# Patient Record
Sex: Male | Born: 1990 | Race: Black or African American | Hispanic: No | Marital: Married | Smoking: Never smoker
Health system: Southern US, Community
[De-identification: ages and names within clinical notes are randomized; demographics above are authoritative.]

## PROBLEM LIST (undated history)

## (undated) HISTORY — PX: FOOT SURGERY: SHX648

---
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
## 2012-11-26 ENCOUNTER — Emergency Department (HOSPITAL_COMMUNITY)
Admission: EM | Admit: 2012-11-26 | Discharge: 2012-11-26 | Disposition: A | Discharge status: Home / Self Care | Payer: Self-pay | Attending: Emergency Medicine | Admitting: Emergency Medicine

## 2012-11-26 ENCOUNTER — Encounter (HOSPITAL_COMMUNITY): Payer: Self-pay

## 2012-11-26 ENCOUNTER — Emergency Department (HOSPITAL_COMMUNITY): Payer: Self-pay

## 2012-11-26 DIAGNOSIS — R51 Headache: Secondary | ICD-10-CM

## 2012-11-26 DIAGNOSIS — L02619 Cutaneous abscess of unspecified foot: Secondary | ICD-10-CM | POA: Insufficient documentation

## 2012-11-26 DIAGNOSIS — L03039 Cellulitis of unspecified toe: Secondary | ICD-10-CM | POA: Insufficient documentation

## 2012-11-26 DIAGNOSIS — L089 Local infection of the skin and subcutaneous tissue, unspecified: Secondary | ICD-10-CM

## 2012-11-26 MED ORDER — KETOROLAC TROMETHAMINE 60 MG/2ML IM SOLN
60.0000 mg | Freq: Once | INTRAMUSCULAR | Status: AC
  Administered 2012-11-26: 60 mg via INTRAMUSCULAR
  Filled 2012-11-26: qty 2

## 2012-11-26 MED ORDER — CIPROFLOXACIN HCL 500 MG PO TABS: 500.0000 mg | ORAL_TABLET | Freq: Two times a day (BID) | ORAL | Status: DC

## 2012-11-26 NOTE — Discharge Instructions (Signed)
Take antibiotic as prescribed.  Follow up with the podiatrist.  Please return to the ER if your headache or swelling/redness/pain in toe worsens or you develop associated fever.

## 2012-11-26 NOTE — ED Provider Notes (Signed)
History     CSN: [id number]  Arrival date & time 11/26/12  4098   First MD Initiated Contact with Patient 11/26/12 [phone]      Chief Complaint  Patient presents with  . Headache  . Abscess    (Consider location/radiation/quality/duration/timing/severity/associated sxs/prior treatment) HPI History provided by pt.   Pt presents w/ multiple complaints.  Has had an intermittent, throbbing, non-radiating, right-sided headache since yesterday at 6pm.  No associated fever, dizziness, vision changes, extremity weakness/paresthesias, N/V.  Denies head trauma.  No h/o migraines.  Also c/o L great toe pain x several months, acutely worsened over past 2-3 days.  Now with drainage around toenail as well.  Believes he stubbed it at work prior to onset. No PMH.   History reviewed. No pertinent past medical history.  History reviewed. No pertinent past surgical history.  No family history on file.  History  Substance Use Topics  . Smoking status: Never Smoker   . Smokeless tobacco: Not on file  . Alcohol Use: No      Review of Systems  All other systems reviewed and are negative.    Allergies  Review of patient's allergies indicates no known allergies.  Home Medications  No current outpatient prescriptions on file.  BP 134/72  Pulse 59  Temp(Src) 98.6 F (37 C) (Oral)  Resp 18  Ht 5\' 6"  (1.676 m)  Wt 130 lb (58.968 kg)  BMI 20.99 kg/m2  SpO2 99%  Physical Exam  Nursing note and vitals reviewed. Constitutional: He is oriented to person, place, and time. He appears well-developed and well-nourished. No distress.  HENT:  Head: Normocephalic and atraumatic.  Eyes:  Normal appearance  Neck: Normal range of motion.  Cardiovascular: Normal rate, regular rhythm and intact distal pulses.   Pulmonary/Chest: Effort normal and breath sounds normal.  Musculoskeletal: Normal range of motion.  L great toe edematous compared to R.  Lateral cuticle macerated.  Erythema, yellow drainage  and tenderness bilateral cuticles.  No pain w/ flexion IP joint.  Distal sensation intact.   Neurological: He is alert and oriented to person, place, and time. No sensory deficit. Coordination normal.  CN 3-12 intact.  No nystagmus. 5/5 and equal upper and lower extremity strength.  No past pointing.     Skin: Skin is warm and dry. No rash noted.  Psychiatric: He has a normal mood and affect. His behavior is normal.    ED Course  Procedures (including critical care time)  Labs Reviewed - No data to display No results found.   1. Headache   2. Toe infection       MDM  Healthy 21yo M presents w/ non-traumatic headache.  On exam, pt well-appearing, afebrile, no focal neuro deficits or meningeal signs.  Will treat symptomatically w/ 60mg  IM toradol and reassess.  Also c/o L great toe pain.  Edema, erythema, yellow drainage and tenderness medial and lateral cuticles.  Xray ordered to r/o osteomyelitis.  5:30 AM   Xray negative.  Dr. Freida Busman has examined toe and recommends cipro and podiatry f/u.  Nursing staff has wrapped.  Headache much improved.  I recommended ibuprofen for head/toe pain. Return precautions discussed.  6:09 AM       Otilio Miu, PA-C 11/26/12 [phone]

## 2012-11-26 NOTE — ED Notes (Signed)
Pt states he developed headache right side of his head while at work-states has not tried to take any OTC meds for it.  Ambulatory and speaking full sentences.  Pt also c/o "hang nail" left great toe for the past several months.

## 2012-11-27 NOTE — ED Provider Notes (Signed)
Medical screening examination/treatment/procedure(s) were performed by non-physician practitioner and as supervising physician I was immediately available for consultation/collaboration.  Francenia Chimenti T Mignonne Afonso, MD 11/27/12 0312 

## 2012-11-29 ENCOUNTER — Encounter: Payer: Self-pay | Admitting: Podiatry

## 2012-11-29 ENCOUNTER — Ambulatory Visit (INDEPENDENT_AMBULATORY_CARE_PROVIDER_SITE_OTHER): Payer: Self-pay | Admitting: Podiatry

## 2012-11-29 VITALS — BP 144/70 | HR 72 | Ht 66.0 in | Wt 138.0 lb

## 2012-11-29 DIAGNOSIS — L600 Ingrowing nail: Secondary | ICD-10-CM

## 2012-11-29 DIAGNOSIS — L089 Local infection of the skin and subcutaneous tissue, unspecified: Secondary | ICD-10-CM

## 2012-11-29 NOTE — Progress Notes (Signed)
SUBJECTIVE: 22 y.o. year old male presents with infected ingrown toe nails left great toe for duration of 2 months. He was seen at ER and prescribed antibiotics last week, Cipro 500 mg bid.   OBJECTIVE: DERMATOLOGIC EXAMINATION: Infected and draining toe nail from chronic ingrown nail left hallux with proud flesh.  VASCULAR EXAMINATION OF LOWER LIMBS: Pedal pulses: All pedal pulses are palpable with normal pulsation.   NEUROLOGIC EXAMINATION OF THE LOWER LIMBS: All epicritic and tactile sensations grossly intact.  MUSCULOSKELETAL EXAMINATION: Rectus foot without gross deformities.    ASSESSMENT: Un resolved infected ingrown nail left hallux with Antibiotic therapy.  PLAN: Affected toe was anesthetized with total 5ml mixture of 50/50 0.5% Marcaine plain and 1% Xylocaine plain. Left great toe both borders were reflected with a nail elevator and excised with nail nipper. Proximal nail matrix tissue was cauterized with Phenol soaked cotton applicator x 4 and neutralized with Alcohol soaked cotton applicator. The wound was dressed with Amerigel ointment dressing. Home care instructions and supply dispensed.  Return in 1 week for follow up.

## 2012-12-06 ENCOUNTER — Encounter: Payer: Self-pay | Admitting: Podiatry

## 2012-12-11 ENCOUNTER — Encounter: Payer: Self-pay | Admitting: Podiatry

## 2013-04-01 ENCOUNTER — Emergency Department (HOSPITAL_COMMUNITY)
Admission: EM | Admit: 2013-04-01 | Discharge: 2013-04-01 | Disposition: A | Discharge status: Home / Self Care | Payer: Self-pay | Source: Home / Self Care | Attending: Emergency Medicine | Admitting: Emergency Medicine

## 2013-04-01 ENCOUNTER — Encounter (HOSPITAL_COMMUNITY): Payer: Self-pay | Admitting: Emergency Medicine

## 2013-04-01 MED ORDER — PREDNISONE 10 MG PO TABS: ORAL_TABLET | ORAL | Status: DC

## 2013-04-01 NOTE — ED Provider Notes (Signed)
Medical screening examination/treatment/procedure(s) were performed by non-physician practitioner and as supervising physician I was immediately available for consultation/collaboration.  Leslee Home, M.D.  Reuben Likes, MD 04/01/13 [phone]

## 2013-04-01 NOTE — ED Notes (Signed)
Reports headahce that started Sunday, pain is left side of head, no nausea, no vomiting, no vision changes, no sensitivity to light.  No uri.

## 2013-04-01 NOTE — ED Provider Notes (Signed)
CSN: [id number]     Arrival date & time 04/01/13  2006 History   First MD Initiated Contact with Patient 04/01/13 2119     Chief Complaint  Patient presents with  . Headache   (Consider location/radiation/quality/duration/timing/severity/associated sxs/prior Treatment) HPI Comments: 22 yo male with headache since Sunday 6/10 of pain. NOT the worse headache of life. Left sided pain with out any other symptoms or history of trauma or recent illness. Denies any FHX of aneurysm/ clots. Admits to working 2 jobs with much rest and mild stress increase.  Patient is a 22 y.o. male presenting with headaches.  Headache Associated symptoms: no numbness     History reviewed. No pertinent past medical history. Past Surgical History  Procedure Laterality Date  . Foot surgery Left    History reviewed. No pertinent family history. History  Substance Use Topics  . Smoking status: Never Smoker   . Smokeless tobacco: Never Used  . Alcohol Use: No    Review of Systems  Constitutional: Negative.   Eyes: Negative.   Respiratory: Negative.   Cardiovascular: Negative.   Gastrointestinal: Negative.   Genitourinary: Negative.   Musculoskeletal: Negative.   Skin: Negative.   Neurological: Positive for headaches. Negative for syncope, facial asymmetry, speech difficulty, weakness, light-headedness and numbness.  Psychiatric/Behavioral: Negative.     Allergies  Review of patient's allergies indicates no known allergies.  Home Medications   Current Outpatient Rx  Name  Route  Sig  Dispense  Refill  . ibuprofen (ADVIL,MOTRIN) 200 MG tablet   Oral   Take 200 mg by mouth every 6 (six) hours as needed for pain.         . ciprofloxacin (CIPRO) 500 MG tablet   Oral   Take 1 tablet (500 mg total) by mouth 2 (two) times daily.   20 tablet   0    BP 109/68  Pulse 64  Temp(Src) 98.2 F (36.8 C) (Oral)  Resp 18  SpO2 100% Physical Exam  Nursing note and vitals reviewed. Constitutional: He  is oriented to person, place, and time. He appears well-developed and well-nourished.  HENT:  Head: Normocephalic and atraumatic.  Right Ear: External ear normal.  Left Ear: External ear normal.  Mouth/Throat: Oropharynx is clear and moist.  Eyes: Conjunctivae and EOM are normal. Pupils are equal, round, and reactive to light.  Neck: Normal range of motion. Neck supple.  Cardiovascular: Normal rate, regular rhythm and normal heart sounds.   Pulmonary/Chest: Effort normal and breath sounds normal.  Musculoskeletal: Normal range of motion.  Neurological: He is alert and oriented to person, place, and time. He has normal reflexes. No cranial nerve deficit. Coordination normal.  Skin: Skin is warm and dry.  Psychiatric: He has a normal mood and affect.    ED Course  Procedures (including critical care time) Labs Review Labs Reviewed - No data to display Imaging Review No results found.  MDM  Headache. Probable migraine due to lack of sleep or stress. Will rest x 1 day and take Prednisone AD. Will go to ER if symptoms increase.    Berenice Primas, PA-C 04/01/13 2139

## 2014-09-28 IMAGING — CR DG TOE GREAT 2+V*L*
3 series · 3 of 3 positions shown · non-contrast
Comparison: None.

CLINICAL DATA: 21-year-old male status post blunt trauma with
swelling.  Oozing at the toenail.

LEFT GREAT TOE

[x toes ap left]
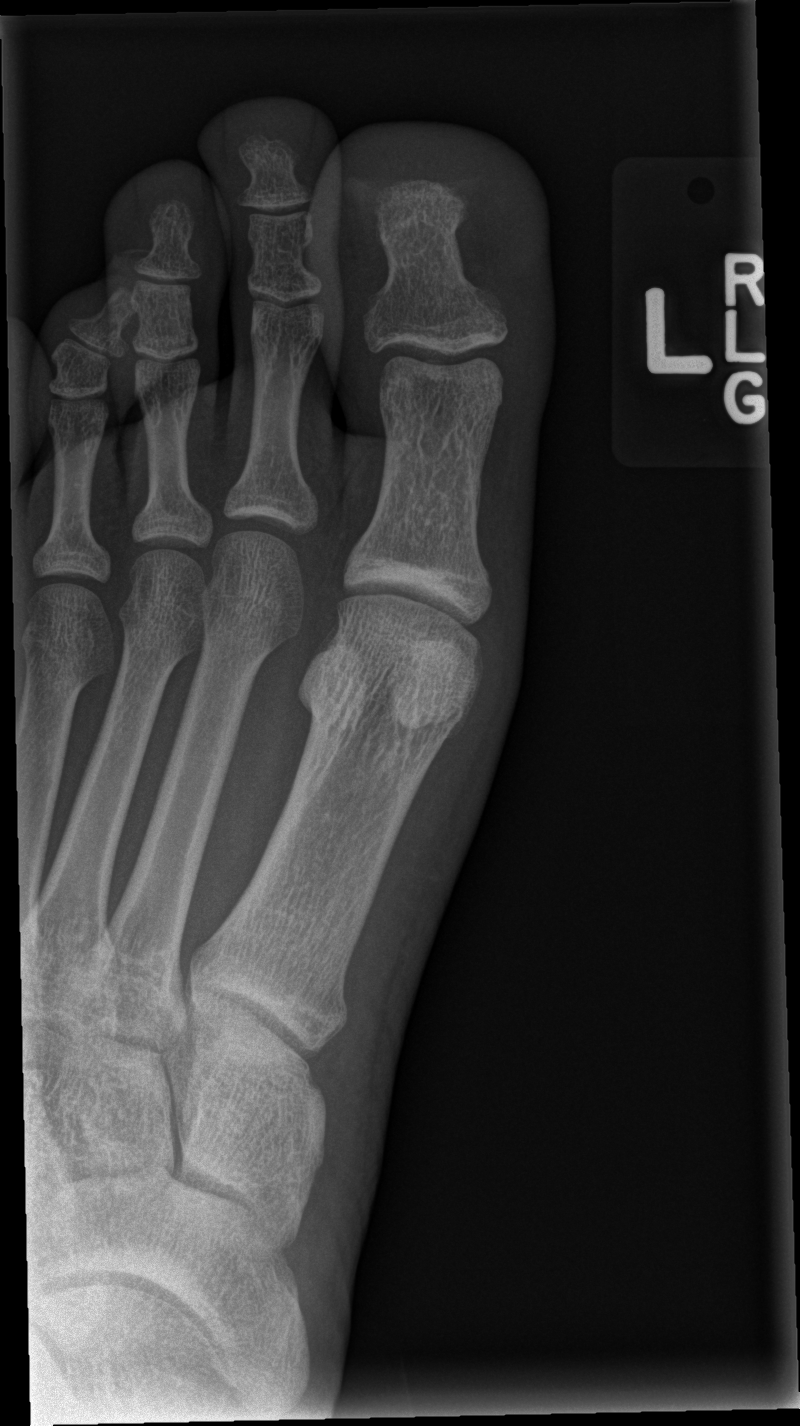

[x toes obl left]
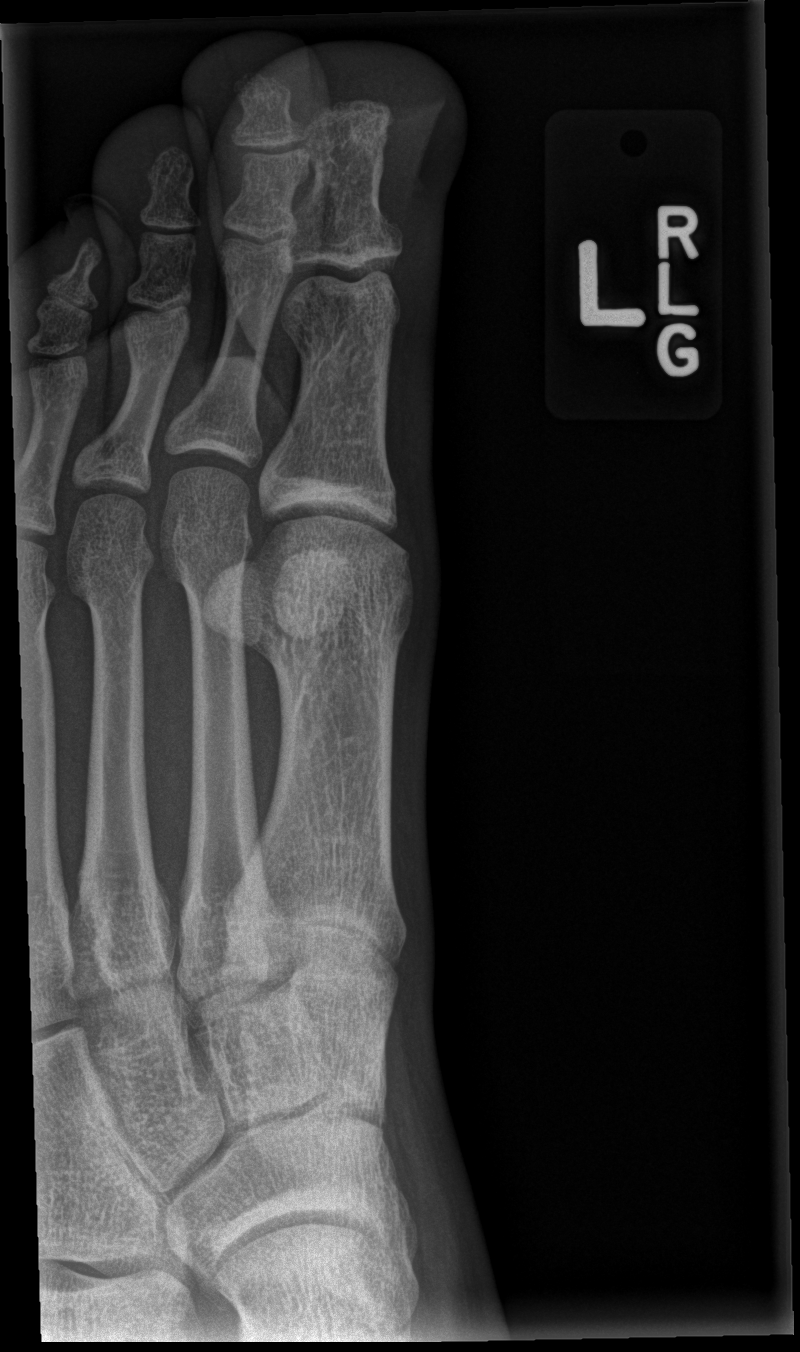

[x toes lat left]
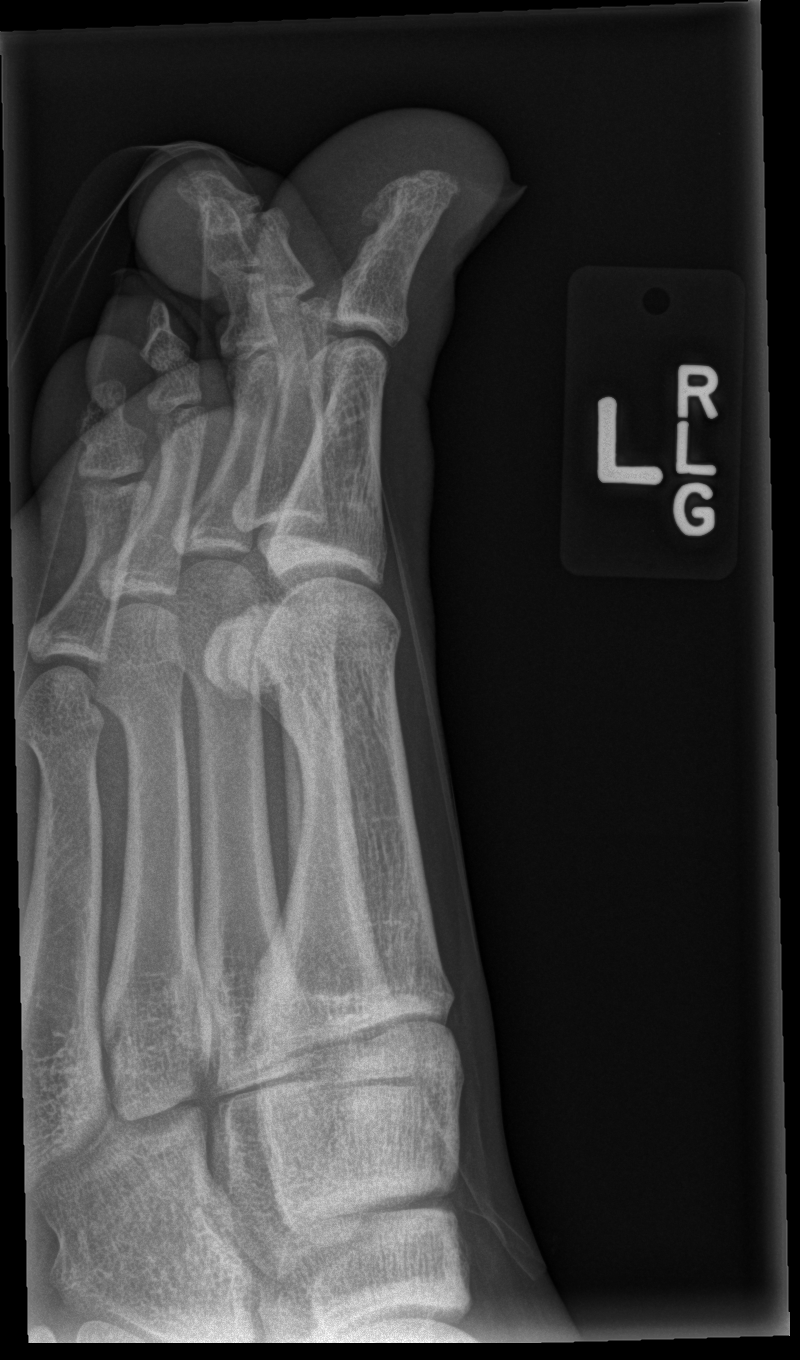

[3 of 3 positions shown; findings below may reference images not displayed]

FINDINGS: No fracture or osteolysis identified at the tuft of the
left first distal phalanx.  Soft tissue swelling.  No subcutaneous
gas.  Normal IP joint.  First proximal phalanx and metatarsal
within normal limits. No radiopaque foreign body identified.  Other
visualized osseous structures are intact.
IMPRESSION: No acute osseous abnormality identified about the left great toe.

## 2016-11-27 ENCOUNTER — Emergency Department (HOSPITAL_COMMUNITY)
Admission: EM | Admit: 2016-11-27 | Discharge: 2016-11-27 | Disposition: A | Discharge status: Home / Self Care | Payer: Self-pay | Attending: Physician Assistant | Admitting: Physician Assistant

## 2016-11-27 ENCOUNTER — Encounter (HOSPITAL_COMMUNITY): Payer: Self-pay | Admitting: Emergency Medicine

## 2016-11-27 DIAGNOSIS — Z79899 Other long term (current) drug therapy: Secondary | ICD-10-CM | POA: Insufficient documentation

## 2016-11-27 DIAGNOSIS — H9201 Otalgia, right ear: Secondary | ICD-10-CM | POA: Insufficient documentation

## 2016-11-27 MED ORDER — PSEUDOEPHEDRINE HCL ER 120 MG PO TB12: 120.0000 mg | ORAL_TABLET | Freq: Two times a day (BID) | ORAL | 0 refills | Status: AC

## 2016-11-27 MED ORDER — PSEUDOEPHEDRINE HCL ER 120 MG PO TB12
120.0000 mg | ORAL_TABLET | Freq: Two times a day (BID) | ORAL | Status: DC
  Administered 2016-11-27: 120 mg via ORAL
  Filled 2016-11-27: qty 1

## 2016-11-27 NOTE — ED Provider Notes (Signed)
[facility]-EMERGENCY DEPT Provider Note   CSN: [id number] Arrival date & time: 11/27/16  0508     History   Chief Complaint Chief Complaint  Patient presents with  . Otalgia  . Dental Pain    HPI Clayton Brown. is a 26 y.o. male.  For the past week patient has had intermittent left ear pain.  He states it went away for 2 days, but came back this morning.  He took ibuprofen with relief of his discomfort.  Denies any discharge or fever.  No URI symptoms      History reviewed. No pertinent past medical history.  Patient Active Problem List   Diagnosis Date Noted  . Infection of toe 11/29/2012  . Ingrown nail 11/29/2012    Past Surgical History:  Procedure Laterality Date  . FOOT SURGERY Left        Home Medications    Prior to Admission medications   Medication Sig Start Date End Date Taking? Authorizing Provider  ciprofloxacin (CIPRO) 500 MG tablet Take 1 tablet (500 mg total) by mouth 2 (two) times daily. 11/26/12   Schinlever, Santina Evans, PA-C  ibuprofen (ADVIL,MOTRIN) 200 MG tablet Take 200 mg by mouth every 6 (six) hours as needed for pain.    [provider]  predniSONE (DELTASONE) 10 MG tablet Take 1 pill tonight. Start 1 pill 3 times a day with meal for 3 days, then 1 pill 2 times a day with meal for 2 days, then 1 pill a day with meal for 2days 04/01/13   Loree Fee, PA-C  pseudoephedrine (SUDAFED 12 HOUR) 120 MG 12 hr tablet Take 1 tablet (120 mg total) by mouth 2 (two) times daily. 11/27/16 12/07/16  Earley Favor, NP    Family History No family history on file.  Social History Social History  Substance Use Topics  . Smoking status: Never Smoker  . Smokeless tobacco: Never Used  . Alcohol use No     Allergies   Patient has no known allergies.   Review of Systems Review of Systems  Constitutional: Negative for fever.  HENT: Positive for ear pain. Negative for congestion, rhinorrhea and sinus pressure.   Gastrointestinal: Negative for  nausea.  Neurological: Negative for headaches.  All other systems reviewed and are negative.    Physical Exam Updated Vital Signs BP 129/84 (BP Location: Left Arm)   Pulse (!) 52   Temp 98 F (36.7 C) (Oral)   Resp 18   SpO2 100%   Physical Exam  Constitutional: He appears well-developed and well-nourished.  HENT:  Head: Normocephalic.  Right Ear: External ear normal. No drainage, swelling or tenderness. No mastoid tenderness. Tympanic membrane is not injected and not erythematous. A middle ear effusion is present. No decreased hearing is noted.  Left Ear: External ear normal.  Eyes: Pupils are equal, round, and reactive to light.  Neck: Normal range of motion.  Cardiovascular: Normal rate.   Pulmonary/Chest: Effort normal.  Abdominal: Soft.  Musculoskeletal: Normal range of motion.  Skin: Skin is warm.  Psychiatric: He has a normal mood and affect.  Nursing note and vitals reviewed.    ED Treatments / Results  Labs (all labs ordered are listed, but only abnormal results are displayed) Labs Reviewed - No data to display  EKG  EKG Interpretation None       Radiology No results found.  Procedures Procedures (including critical care time)  Medications Ordered in ED Medications  pseudoephedrine (SUDAFED) 12 hr tablet 120 mg (not administered)  Initial Impression / Assessment and Plan / ED Course  I have reviewed the triage vital signs and the nursing notes.  Pertinent labs & imaging results that were available during my care of the patient were reviewed by me and considered in my medical decision making (see chart for details).    Patient with no dental source for his ear pain.  TM pearly gray, nonbulging.  No canal erythema. We'll prescribe Sudafed as I think this is more allergy related as the source of his discomfort   Final Clinical Impressions(s) / ED Diagnoses   Final diagnoses:  Right ear pain    New Prescriptions New Prescriptions    PSEUDOEPHEDRINE (SUDAFED 12 HOUR) 120 MG 12 HR TABLET    Take 1 tablet (120 mg total) by mouth 2 (two) times daily.     Earley FavorSchulz, Remona Boom, NP 11/27/16 0531    Abelino DerrickMackuen, Courteney Lyn, MD 11/27/16 0700

## 2016-11-27 NOTE — ED Triage Notes (Signed)
Pt c/o right ear pain and right upper toothache ongoing for about 1 week. Pt tried ibuprofen 1 hour PTA with some relief.

## 2016-11-27 NOTE — Discharge Instructions (Signed)
At this time you do not have an infection  You have been given a prescription for a decongestant please take this on a regular basis for the next week to 10 days

## 2016-11-28 ENCOUNTER — Emergency Department (HOSPITAL_COMMUNITY)
Admission: EM | Admit: 2016-11-28 | Discharge: 2016-11-29 | Disposition: A | Discharge status: Home / Self Care | Payer: Self-pay | Attending: Emergency Medicine | Admitting: Emergency Medicine

## 2016-11-28 ENCOUNTER — Encounter (HOSPITAL_COMMUNITY): Payer: Self-pay

## 2016-11-28 DIAGNOSIS — H65111 Acute and subacute allergic otitis media (mucoid) (sanguinous) (serous), right ear: Secondary | ICD-10-CM | POA: Insufficient documentation

## 2016-11-28 NOTE — ED Triage Notes (Signed)
Pt reports right ear pain, states he has fluid in his right ear. He states he was seen here yesterday and bought a nasal decongestant OTC but is here tonight because he states it is not helping and is requesting "something stronger."

## 2016-11-29 MED ORDER — DEXAMETHASONE SODIUM PHOSPHATE 10 MG/ML IJ SOLN
10.0000 mg | Freq: Once | INTRAMUSCULAR | Status: AC
  Administered 2016-11-29: 10 mg via INTRAMUSCULAR
  Filled 2016-11-29: qty 1

## 2016-11-29 MED ORDER — LORATADINE 10 MG PO TABS: 10.0000 mg | ORAL_TABLET | Freq: Every day | ORAL | 0 refills | Status: DC

## 2016-11-29 NOTE — ED Provider Notes (Signed)
[facility]PT Provider Note   CSN: [id number] Arrival date & time: 11/28/16  2235     History   Chief Complaint Chief Complaint  Patient presents with  . Otalgia    HPI Clayton Brown. is a 27 y.o. male.  HPI  26 y.o. male presents to the Emergency Department today complaining of right ear pain. Seen Monday for same. Given nasal decongestant and states it is not helping. No fevers. No N/V. No ;eft ear pain. No sore throats, No sinus congestion. No cough. No CP/SOB. No drainage from ear. No redness or decrease hearing. No pain currently. No other symptoms noted.   History reviewed. No pertinent past medical history.  Patient Active Problem List   Diagnosis Date Noted  . Infection of toe 11/29/2012  . Ingrown nail 11/29/2012    Past Surgical History:  Procedure Laterality Date  . FOOT SURGERY Left        Home Medications    Prior to Admission medications   Medication Sig Start Date End Date Taking? Authorizing Provider  ciprofloxacin (CIPRO) 500 MG tablet Take 1 tablet (500 mg total) by mouth 2 (two) times daily. Patient not taking: Reported on 11/27/2016 11/26/12   Schinlever, Santina Evans, PA-C  ibuprofen (ADVIL,MOTRIN) 200 MG tablet Take 200 mg by mouth every 6 (six) hours as needed for pain.    [provider]  predniSONE (DELTASONE) 10 MG tablet Take 1 pill tonight. Start 1 pill 3 times a day with meal for 3 days, then 1 pill 2 times a day with meal for 2 days, then 1 pill a day with meal for 2days Patient not taking: Reported on 11/27/2016 04/01/13   Loree Fee, PA-C  pseudoephedrine (SUDAFED 12 HOUR) 120 MG 12 hr tablet Take 1 tablet (120 mg total) by mouth 2 (two) times daily. 11/27/16 12/07/16  Earley Favor, NP    Family History No family history on file.  Social History Social History  Substance Use Topics  . Smoking status: Never Smoker  . Smokeless tobacco: Never Used  . Alcohol use No     Allergies   Patient has no known  allergies.   Review of Systems Review of Systems  Constitutional: Negative for fever.  HENT: Negative for ear discharge, ear pain, tinnitus and trouble swallowing.   Eyes: Negative for pain and discharge.  Gastrointestinal: Negative for nausea.   Physical Exam Updated Vital Signs BP (!) 141/85 (BP Location: Right Arm)   Pulse 65   Temp 98.5 F (36.9 C) (Oral)   Resp 17   SpO2 100%   Physical Exam  Constitutional: He is oriented to person, place, and time. Vital signs are normal. He appears well-developed and well-nourished. No distress.  HENT:  Head: Normocephalic and atraumatic.  Right Ear: Hearing, tympanic membrane, external ear and ear canal normal.  Left Ear: Hearing, tympanic membrane, external ear and ear canal normal.  Nose: Nose normal.  Mouth/Throat: Uvula is midline, oropharynx is clear and moist and mucous membranes are normal. No trismus in the jaw. No oropharyngeal exudate, posterior oropharyngeal erythema or tonsillar abscesses.  Right ear without erythema. Noted TM with slight bulging and gray. No injection. Able to hear adequately with no differentiation to left. No discharge.   Eyes: Conjunctivae and EOM are normal. Pupils are equal, round, and reactive to light.  Neck: Normal range of motion. Neck supple. No tracheal deviation present.  Cardiovascular: Normal rate, regular rhythm, S1 normal, S2 normal, normal heart sounds, intact distal pulses and  normal pulses.   Pulmonary/Chest: Effort normal and breath sounds normal. No respiratory distress. He has no decreased breath sounds. He has no wheezes. He has no rhonchi. He has no rales.  Abdominal: Normal appearance and bowel sounds are normal. There is no tenderness.  Musculoskeletal: Normal range of motion.  Neurological: He is alert and oriented to person, place, and time.  Skin: Skin is warm and dry.  Psychiatric: He has a normal mood and affect. His speech is normal and behavior is normal. Thought content  normal.     ED Treatments / Results  Labs (all labs ordered are listed, but only abnormal results are displayed) Labs Reviewed - No data to display  EKG  EKG Interpretation None       Radiology No results found.  Procedures Procedures (including critical care time)  Medications Ordered in ED Medications - No data to display   Initial Impression / Assessment and Plan / ED Course  I have reviewed the triage vital signs and the nursing notes.  Pertinent labs & imaging results that were available during my care of the patient were reviewed by me and considered in my medical decision making (see chart for details).  Final Clinical Impressions(s) / ED Diagnoses     {I have reviewed the relevant previous healthcare records.  {I obtained HPI from historian.   ED Course:  Assessment: Pt is a 26 y.o. male who presents with right eaer fullness. Seen in ED for same Monday. Given Pseudophed with minimal relief. Notes no discharge. No redness. No fevers. No pain currently.. On exam, pt in NAD. Nontoxic/nonseptic appearing. VSS. Afebrile. Lungs CTA. Heart RRR. Right ear with TM slight bulge and gray. No erythema. Likely allergic. Given decadron in ED. DC with Claritin. Plan is to DC home with follow up to PCP. At time of discharge, Patient is in no acute distress. Vital Signs are stable. Patient is able to ambulate. Patient able to tolerate PO.   Disposition/Plan:  DC Home Additional Verbal discharge instructions given and discussed with patient.  Pt Instructed to f/u with PCP in the next week for evaluation and treatment of symptoms. Return precautions given Pt acknowledges and agrees with plan  Supervising Physician Horton, Mayer Maskerourtney F, MD  Final diagnoses:  Acute allergic otitis media of right ear, recurrence not specified    New Prescriptions New Prescriptions   No medications on file     Audry PiliMohr, Jemal Miskell, PA-C 11/29/16 0050    Shon BatonHorton, Courtney F, MD 11/29/16 [phone]

## 2016-11-29 NOTE — ED Notes (Signed)
See EDP note for complete assessment.
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
## 2021-08-10 ENCOUNTER — Ambulatory Visit (INDEPENDENT_AMBULATORY_CARE_PROVIDER_SITE_OTHER): Payer: Self-pay

## 2021-08-10 ENCOUNTER — Other Ambulatory Visit: Payer: Self-pay

## 2021-08-10 ENCOUNTER — Encounter (HOSPITAL_COMMUNITY): Payer: Self-pay

## 2021-08-10 ENCOUNTER — Ambulatory Visit (HOSPITAL_COMMUNITY)
Admission: EM | Admit: 2021-08-10 | Discharge: 2021-08-10 | Disposition: A | Discharge status: Home / Self Care | Payer: Self-pay | Attending: Physician Assistant | Admitting: Physician Assistant

## 2021-08-10 DIAGNOSIS — G44309 Post-traumatic headache, unspecified, not intractable: Secondary | ICD-10-CM

## 2021-08-10 DIAGNOSIS — M25512 Pain in left shoulder: Secondary | ICD-10-CM

## 2021-08-10 MED ORDER — NAPROXEN 500 MG PO TABS: 500.0000 mg | ORAL_TABLET | Freq: Two times a day (BID) | ORAL | 0 refills | Status: DC

## 2021-08-10 MED ORDER — TIZANIDINE HCL 4 MG PO TABS: 4.0000 mg | ORAL_TABLET | Freq: Three times a day (TID) | ORAL | 0 refills | Status: DC | PRN

## 2021-08-10 NOTE — Discharge Instructions (Signed)
Your x-ray was normal.  Please follow-up with orthopedics if symptoms not improving with conservative treatments.  Take Naprosyn twice daily.  Do not take additional NSAIDs including aspirin, ibuprofen/Advil, naproxen/Aleve with this medication as can cause stomach bleeding.  Use tizanidine up to 3 times a day as needed for muscle pain.  This can make you sleepy so do not drive or drink alcohol while taking it.  You can use Tylenol, heat, gentle stretch for symptom relief.  Follow-up with orthopedics for further evaluation and management.  If you have any worsening symptoms including severe pain, numbness or weakness in your arm/hand, worst headache of your life, nausea/vomiting, confusion you need to go to the emergency room.
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
## 2023-03-19 ENCOUNTER — Emergency Department (HOSPITAL_COMMUNITY)
Admission: EM | Admit: 2023-03-19 | Discharge: 2023-03-19 | Disposition: A | Discharge status: Home / Self Care | Payer: Self-pay | Attending: Emergency Medicine | Admitting: Emergency Medicine

## 2023-03-19 ENCOUNTER — Encounter (HOSPITAL_COMMUNITY): Payer: Self-pay | Admitting: Emergency Medicine

## 2023-03-19 ENCOUNTER — Emergency Department (HOSPITAL_COMMUNITY): Payer: Self-pay

## 2023-03-19 ENCOUNTER — Other Ambulatory Visit: Payer: Self-pay

## 2023-03-19 DIAGNOSIS — M25511 Pain in right shoulder: Secondary | ICD-10-CM | POA: Insufficient documentation

## 2023-03-19 DIAGNOSIS — X509XXA Other and unspecified overexertion or strenuous movements or postures, initial encounter: Secondary | ICD-10-CM | POA: Insufficient documentation
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
## 2023-03-19 NOTE — ED Triage Notes (Signed)
Patient reports he "popped his right shoulder out of place" yesterday and has been having pain ever since.  Patient states he feels like his shoulder is back in place and has full ROM.

## 2023-03-19 NOTE — Progress Notes (Signed)
Orthopedic Tech Progress Note Patient Details:  Clayton Brown March 12, 1991 [id number]  Ortho Devices Type of Ortho Device: Arm sling Ortho Device/Splint Location: rue Ortho Device/Splint Interventions: Ordered, Application, Adjustment   Post Interventions Patient Tolerated: Well Instructions Provided: Care of device, Adjustment of device  Trinna Post 03/19/2023, 11:15 PM
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
## 2023-10-23 ENCOUNTER — Ambulatory Visit (HOSPITAL_COMMUNITY)
Admission: EM | Admit: 2023-10-23 | Discharge: 2023-10-23 | Disposition: A | Discharge status: Home / Self Care | Payer: Self-pay

## 2023-10-23 ENCOUNTER — Encounter (HOSPITAL_COMMUNITY): Payer: Self-pay

## 2023-10-23 DIAGNOSIS — R42 Dizziness and giddiness: Secondary | ICD-10-CM

## 2023-10-23 NOTE — Discharge Instructions (Addendum)
 Please return if needed.

## 2023-10-23 NOTE — ED Provider Notes (Signed)
 [facility]    CSN: [id number] Arrival date & time: 10/23/23  1639      History   Chief Complaint Chief Complaint  Patient presents with   Dizziness    HPI Clayton Brown. is a 33 y.o. male.  This morning had an episode of dizziness after standing up, took some ibuprofen and laid back down. Unfortunately he missed work due to the symptom and requires a note.  He is not currently having dizziness. No headache, vision changes, fever, weakness. Reports he feels great  Eating and drinking normally. No vomiting/diarrhea   History reviewed. No pertinent past medical history.  Patient Active Problem List   Diagnosis Date Noted   Infection of toe 11/29/2012   Ingrown nail 11/29/2012    Past Surgical History:  Procedure Laterality Date   FOOT SURGERY Left        Home Medications    Prior to Admission medications   Not on File    Family History History reviewed. No pertinent family history.  Social History Social History   Tobacco Use   Smoking status: Never   Smokeless tobacco: Never  Substance Use Topics   Alcohol use: No   Drug use: No     Allergies   Patient has no known allergies.   Review of Systems Review of Systems Per HPI  Physical Exam Triage Vital Signs ED Triage Vitals  Encounter Vitals Group     BP 10/23/23 1648 127/80     Systolic BP Percentile --      Diastolic BP Percentile --      Pulse Rate 10/23/23 1648 71     Resp 10/23/23 1648 18     Temp 10/23/23 1648 98.7 F (37.1 C)     Temp Source 10/23/23 1648 Oral     SpO2 10/23/23 1648 97 %     Weight --      Height --      Head Circumference --      Peak Flow --      Pain Score 10/23/23 1649 0     Pain Loc --      Pain Education --      Exclude from Growth Chart --    No data found.  Updated Vital Signs BP 127/80 (BP Location: Right Arm)   Pulse 71   Temp 98.7 F (37.1 C) (Oral)   Resp 18   SpO2 97%    Physical Exam Vitals and nursing note reviewed.   Constitutional:      General: He is not in acute distress.    Appearance: He is not ill-appearing or diaphoretic.  HENT:     Mouth/Throat:     Mouth: Mucous membranes are moist.     Pharynx: Oropharynx is clear.  Eyes:     Extraocular Movements: Extraocular movements intact.     Conjunctiva/sclera: Conjunctivae normal.     Pupils: Pupils are equal, round, and reactive to light.  Cardiovascular:     Rate and Rhythm: Normal rate and regular rhythm.     Pulses: Normal pulses.     Heart sounds: Normal heart sounds.  Pulmonary:     Effort: Pulmonary effort is normal.     Breath sounds: Normal breath sounds.  Musculoskeletal:     Cervical back: Normal range of motion. No rigidity.  Skin:    General: Skin is warm and dry.  Neurological:     General: No focal deficit present.     Mental Status:  He is alert and oriented to person, place, and time.     Cranial Nerves: Cranial nerves 2-12 are intact.     Sensory: Sensation is intact.     Motor: Motor function is intact. No weakness or pronator drift.     Coordination: Coordination is intact. Romberg sign negative.     Gait: Gait is intact.     Comments: Strength and sensation normal      UC Treatments / Results  Labs (all labs ordered are listed, but only abnormal results are displayed) Labs Reviewed - No data to display  EKG  Radiology No results found.  Procedures Procedures  Medications Ordered in UC Medications - No data to display  Initial Impression / Assessment and Plan / UC Course  I have reviewed the triage vital signs and the nursing notes.  Pertinent labs & imaging results that were available during my care of the patient were reviewed by me and considered in my medical decision making (see chart for details).  Symptoms were short lived this morning. No current issues. Feeling fine. Patient is neurologically intact, stable vitals No red flags A note for work is provided  Advised returning to clinic with any  concerns or new symptoms  Final Clinical Impressions(s) / UC Diagnoses   Final diagnoses:  Dizziness   Discharge Instructions   None    ED Prescriptions   None    PDMP not reviewed this encounter.   Clarivel Callaway, Lurena Joiner, [place] 10/23/23 1708

## 2023-10-23 NOTE — ED Triage Notes (Signed)
 Pt states woke up this am and felt dizzy. States took ibuprofen and laid by down. States feels better now. States needs a work note.
